# Patient Record
Sex: Female | Born: 2000
Health system: Southern US, Community
[De-identification: ages and names within clinical notes are randomized; demographics above are authoritative.]

---
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
## 2020-07-20 ENCOUNTER — Telehealth: Payer: Self-pay

## 2020-07-20 ENCOUNTER — Ambulatory Visit: Payer: Self-pay

## 2020-07-20 NOTE — Telephone Encounter (Signed)
Called patient to screen prior to coming to clinic. Patient reports she was on vacation last week; returned Sunday and returned to work Wednesday. States she felt fine when she went to sleep last night but woke up this morning with right leg pain. Reports that the pain is primarily sharp pains in her groin and knee, but will shift to her hips and ankle also.   Discussed with K. Arlana Pouch, NP. Patient needs further work up than what urgent care can offer. Informed patient and instructed her to go to the [facility] ED for r/o of DVT per K. Arlana Pouch.   Patient agreeable to plan.

## 2021-07-31 ENCOUNTER — Other Ambulatory Visit: Payer: Self-pay

## 2021-07-31 ENCOUNTER — Encounter (HOSPITAL_COMMUNITY): Payer: Self-pay | Admitting: Pharmacy Technician

## 2021-07-31 ENCOUNTER — Emergency Department (HOSPITAL_COMMUNITY): Payer: BC Managed Care – PPO

## 2021-07-31 ENCOUNTER — Observation Stay (HOSPITAL_COMMUNITY)
Admission: EM | Admit: 2021-07-31 | Discharge: 2021-08-01 | Disposition: A | Discharge status: Home / Self Care | Payer: BC Managed Care – PPO | Attending: Internal Medicine | Admitting: Internal Medicine

## 2021-07-31 DIAGNOSIS — Z7722 Contact with and (suspected) exposure to environmental tobacco smoke (acute) (chronic): Secondary | ICD-10-CM | POA: Insufficient documentation

## 2021-07-31 DIAGNOSIS — Z20822 Contact with and (suspected) exposure to covid-19: Secondary | ICD-10-CM | POA: Diagnosis not present

## 2021-07-31 DIAGNOSIS — D72829 Elevated white blood cell count, unspecified: Secondary | ICD-10-CM | POA: Insufficient documentation

## 2021-07-31 DIAGNOSIS — R55 Syncope and collapse: Principal | ICD-10-CM | POA: Insufficient documentation

## 2021-07-31 LAB — CBC WITH DIFFERENTIAL/PLATELET
Abs Immature Granulocytes: 0.08 10*3/uL — ABNORMAL HIGH (ref 0.00–0.07)
Basophils Absolute: 0.1 10*3/uL (ref 0.0–0.1)
Basophils Relative: 0 %
Eosinophils Absolute: 0 10*3/uL (ref 0.0–0.5)
Eosinophils Relative: 0 %
HCT: 43.9 % (ref 36.0–46.0)
Hemoglobin: 15.1 g/dL — ABNORMAL HIGH (ref 12.0–15.0)
Immature Granulocytes: 0 %
Lymphocytes Relative: 17 %
Lymphs Abs: 3.2 10*3/uL (ref 0.7–4.0)
MCH: 31 pg (ref 26.0–34.0)
MCHC: 34.4 g/dL (ref 30.0–36.0)
MCV: 90.1 fL (ref 80.0–100.0)
Monocytes Absolute: 0.9 10*3/uL (ref 0.1–1.0)
Monocytes Relative: 5 %
Neutro Abs: 14.5 10*3/uL — ABNORMAL HIGH (ref 1.7–7.7)
Neutrophils Relative %: 78 %
Platelets: 336 10*3/uL (ref 150–400)
RBC: 4.87 MIL/uL (ref 3.87–5.11)
RDW: 11.9 % (ref 11.5–15.5)
WBC: 18.8 10*3/uL — ABNORMAL HIGH (ref 4.0–10.5)
nRBC: 0 % (ref 0.0–0.2)

## 2021-07-31 LAB — URINALYSIS, ROUTINE W REFLEX MICROSCOPIC
Bilirubin Urine: NEGATIVE
Glucose, UA: NEGATIVE mg/dL
Ketones, ur: NEGATIVE mg/dL
Nitrite: NEGATIVE
Protein, ur: NEGATIVE mg/dL
Specific Gravity, Urine: 1.012 (ref 1.005–1.030)
pH: 8 (ref 5.0–8.0)

## 2021-07-31 LAB — BASIC METABOLIC PANEL
Anion gap: 10 (ref 5–15)
BUN: 8 mg/dL (ref 6–20)
CO2: 23 mmol/L (ref 22–32)
Calcium: 9.8 mg/dL (ref 8.9–10.3)
Chloride: 104 mmol/L (ref 98–111)
Creatinine, Ser: 0.79 mg/dL (ref 0.44–1.00)
GFR, Estimated: >60 mL/min (ref >60)
Glucose, Bld: 112 mg/dL — ABNORMAL HIGH (ref 70–99)
Potassium: 3.5 mmol/L (ref 3.5–5.1)
Sodium: 137 mmol/L (ref 135–145)

## 2021-07-31 LAB — MAGNESIUM: Magnesium: 2 mg/dL (ref 1.7–2.4)

## 2021-07-31 LAB — CBG MONITORING, ED: Glucose-Capillary: 111 mg/dL — ABNORMAL HIGH (ref 70–99)

## 2021-07-31 LAB — I-STAT BETA HCG BLOOD, ED (MC, WL, AP ONLY): I-stat hCG, quantitative: <5 m[IU]/mL (ref <5)

## 2021-07-31 MED ORDER — ACETAMINOPHEN 650 MG RE SUPP: 650.0000 mg | Freq: Four times a day (QID) | RECTAL | Status: DC | PRN

## 2021-07-31 MED ORDER — ENOXAPARIN SODIUM 40 MG/0.4ML IJ SOSY: 40.0000 mg | PREFILLED_SYRINGE | INTRAMUSCULAR | Status: DC

## 2021-07-31 MED ORDER — ACETAMINOPHEN 325 MG PO TABS: 650.0000 mg | ORAL_TABLET | Freq: Four times a day (QID) | ORAL | Status: DC | PRN

## 2021-07-31 NOTE — ED Notes (Signed)
Pt ambulatory to the bathroom without difficulty.

## 2021-07-31 NOTE — H&P (Signed)
History and Physical    Tiffany Russell OBS:[id number] DOB: [birth year] DOA: 07/31/2021  PCP: Pediatrics, [facility] Consultants:  none  Patient coming from:  Home - lives with mom, dad and boyfriend   Chief Complaint: syncope   HPI: Tiffany Russell is a 20 y.o. female with no medical history who presented to ER after syncopal episode. She was on the toilet around10:30am this morning having a BM and passed out, hitting her face on the plumbing as she fell. She immediately regained consciousness and had no loss of urine, tongue biting, confusion or fatigue. Not witnessed seizure activity by boyfriend. They decided to come to hospital as she was worried about a concussion.  While in triage, she was about to have her blood drawn and as they were wiggling the vein, she had another syncopal episode witnessed by nursing. She stopped responding to nurse, tensed up and then went limp and passed out and would not respond to a sternal rub. Nurse counted her carotid pulse to be around 20. She was breathing, but slow and irregular and was brought back to room where she regained consciousness and was alert and oriented. No shaking, no tongue biting and no loss of urine. No post ictal state.   She has had a similar episode when getting her blood taken before in the past.  No congenital heart issues, no sudden cardiac death or early death  history in family. No history of POTS.  She overall has felt well up until this point. Went to [employer] [place] on Friday and worked over the weekend. Denies any fever, chills, headaches, pain, chest pain, palpitations, dysuria, stomach pain, N/V/D.    ED Course: vitals: afebrile, bp: 125/62, HR: 90, RR: 18 and oxygen: 100% on room air. CTH with no acute findings, CXR wnl. Pertinent labs: WBC: 18.8, UA with moderate leukocytes, moderate hgb. Urine pregnancy negative. Magnesium wnl. Had episode of syncope in triage with pulse down to 20. Asked to admit for syncope.   Review of Systems: As per  HPI; otherwise review of systems reviewed and negative.   Ambulatory Status:  Ambulates without assistance  History reviewed. No pertinent past medical history.  History reviewed. No pertinent surgical history.  Social History   Socioeconomic History   Marital status: Single    Spouse name: Not on file   Number of children: Not on file   Years of education: Not on file   Highest education level: Not on file  Occupational History   Not on file  Tobacco Use   Smoking status: Passive Smoke Exposure - Never Smoker   Smokeless tobacco: Never  Substance and Sexual Activity   Alcohol use: No   Drug use: No   Sexual activity: Not on file  Other Topics Concern   Not on file  Social History Narrative   Not on file   Social Determinants of Health   Financial Resource Strain: Not on file  Food Insecurity: Not on file  Transportation Needs: Not on file  Physical Activity: Not on file  Stress: Not on file  Social Connections: Not on file  Intimate Partner Violence: Not on file    No Known Allergies  No family history on file.  Prior to Admission medications   Medication Sig Start Date End Date Taking? Authorizing Provider  amoxicillin (AMOXIL) 875 MG tablet Take 1 tablet (875 mg total) by mouth 2 (two) times daily. 08/21/17   Deatra Canter, FNP  cyclobenzaprine (FLEXERIL) 5 MG tablet Take 1 tablet (  5 mg total) by mouth every 8 (eight) hours as needed for muscle spasms. 06/15/15   Hess, Nada Boozer, PA-C  lidocaine (XYLOCAINE) 2 % solution Use as directed 10 mLs in the mouth or throat as needed for mouth pain. 08/21/17   Deatra Canter, FNP  naproxen (NAPROSYN) 500 MG tablet Take 1 tablet (500 mg total) by mouth 2 (two) times daily. 06/15/15   Kathrynn Speed, PA-C    Physical Exam: Vitals:   07/31/21 1445 07/31/21 1500 07/31/21 1515 07/31/21 1655  BP: 103/62 115/63 107/82 119/69  Pulse: 72 85 81 92  Resp: 14 19 20  (!) 22  Temp:      SpO2: 99% 97% 100% 97%     General:   Appears calm and comfortable and is in NAD Eyes:  PERRL, EOMI, normal lids, iris ENT:  grossly normal hearing, lips & tongue, mmm; appropriate dentition Neck:  no LAD, masses or thyromegaly; no carotid bruits Cardiovascular:  RRR, no m/r/g. No LE edema.  Respiratory:   CTA bilaterally with no wheezes/rales/rhonchi.  Normal respiratory effort. Abdomen:  soft, NT, ND, NABS Back:   normal alignment, no CVAT Skin:  no rash or induration seen on limited exam Musculoskeletal:  grossly normal tone BUE/BLE, good ROM, no bony abnormality Lower extremity:  No LE edema.  Limited foot exam with no ulcerations.  2+ distal pulses. Psychiatric:  grossly normal mood and affect, speech fluent and appropriate, AOx3 Neurologic:  CN 2-12 grossly intact, moves all extremities in coordinated fashion, sensation intact    Radiological Exams on Admission: Independently reviewed - see discussion in A/P where applicable  CT HEAD WO CONTRAST ( )  Result Date: 07/31/2021 CLINICAL DATA:  Head trauma, loss of consciousness EXAM: CT HEAD WITHOUT CONTRAST TECHNIQUE: Contiguous axial images were obtained from the base of the skull through the vertex without intravenous contrast. COMPARISON:  March 21, 2018 FINDINGS: Brain: No evidence of acute large vascular territory infarction, hemorrhage, hydrocephalus, extra-axial collection or mass lesion/mass effect. Vascular: No hyperdense vessel or unexpected calcification. Skull: Normal. Negative for fracture or focal lesion. Sinuses/Orbits: Mucous retention cyst fills the left sphenoid sinus otherwise the visualized portions of the paranasal sinuses and mastoid air cells are predominantly clear. Orbits are unremarkable. Other: None. IMPRESSION: 1. No acute intracranial findings. 2. Left sphenoid sinus mucous retention cyst. Electronically Signed   By: March 23, 2018 MD   On: 07/31/2021 16:20   DG Chest Portable 1 View  Result Date: 07/31/2021 CLINICAL DATA:  Syncope EXAM: PORTABLE  CHEST 1 VIEW COMPARISON:  None. FINDINGS: The cardiomediastinal silhouette is within normal limits. The lungs are clear, with no focal consolidation or pulmonary edema. There is no pleural effusion or pneumothorax. The bones are unremarkable. IMPRESSION: No radiographic evidence of acute cardiopulmonary process. Electronically Signed   By: 09/30/2021 MD   On: 07/31/2021 14:42    EKG: Independently reviewed.  NSR with rate 92; nonspecific ST changes with no evidence of acute ischemia   Labs on Admission: I have personally reviewed the available labs and imaging studies at the time of the admission.  Pertinent labs:  Pertinent labs: WBC: 18.8 UA with moderate leukocytes, moderate hgb.   Assessment/Plan Principal Problem:   Syncope and collapse Due to x2 episodes of syncope and HR to 20 in ER will place her in observation with syncope work up. Episodes sound more like vaso vagal in nature and with no post ictal state, loss of urine, tongue biting, etc. Do not feel like she  is having absent seizures.  Orthostatic vitals ordered Echo pending Place on telemetry Encouraged PO intake of liquids   Active Problems:   Leukocytosis Likely reactive as she has no fever or source of infection Encouraged PO intake and will follow in AM Urine has some abnormal findings, but she has no dysuria, frequency or urgency so will not check culture at this time Microscopic hematuria: spotting with copper IUD.    There is no height or weight on file to calculate BMI.   Level of care: Telemetry Medical DVT prophylaxis:  SCDs and ambulation  Code Status:  Full - confirmed with patient Family Communication: mother present at bedside: Tiffany Russell  Disposition Plan:  The patient is from: home  Anticipated d/c is to: home  Anticipated d/c date will depend on clinical response to treatment, but possibly as early as tomorrow if she has excellent response to treatment  Requires inpatient hospitalization due to  risk of cardiac decompensation and risk of future syncope and collapse due to possible cardiac cause that requires overnight monitoring, assessment and work up.  Consults called: none   Admission status:  observation    Orland Mustard MD [facility]   How to contact the [facility] Attending or Consulting provider 7A - 7P or covering provider during after hours 7P -7A, for this patient?  Check the care team in [facility] and look for a) attending/consulting [facility] provider listed and b) the [facility] team listed Log into [email or web address] and use [facility] universal password to access. If you do not have the password, please contact the hospital operator. Locate the [facility] provider you are looking for under [facility] and page to a number that you can be directly reached. If you still have difficulty reaching the provider, please page the [facility] (Director on Call) for the Hospitalists listed on amion for assistance.   07/31/2021, 6:25 PM

## 2021-07-31 NOTE — ED Triage Notes (Signed)
Pt here for evaluation after having a syncopal event while having a bowel movement. Pt states she woke up on the floor and hit her head on the floor. Pt with hx concussion. Neurologically intact.

## 2021-07-31 NOTE — ED Notes (Signed)
Pt NAD, a/ox4, states was trying to have BM today and had syncopal episode while on toilet, falling over and hitting head. Bruise noted to left temple/forehead. Pt states she also passed out during blood draw while here in the ED. Denies dizziness, CP, SOB, ABD pain, n/v/d. VSS.

## 2021-07-31 NOTE — ED Notes (Signed)
While pt sitting in triage pt tensed up, pupils became a 7, pt no longer responding. Pt then went flaccid and pt would not respond to sternal rub. Carotid pulse faint with rate around 20. Pt breathing slowed and became irregular. NRB placed and patient moved to treatment room.

## 2021-07-31 NOTE — ED Provider Notes (Signed)
[facility] EMERGENCY DEPARTMENT Provider Note   CSN: [id number] Arrival date & time: 07/31/21  1119     History Chief Complaint  Patient presents with   Loss of Consciousness    Tiffany Russell is a 20 y.o. female.  Presents here with concern for syncope episode.  Patient reports that she was at home in her normal state of health when she was walking to the bathroom and she felt lightheaded like she was going to pass out and then passed out fell and hit her head.  Boyfriend nearby.  No witnessed seizure activity.  No prolonged postictal state.  While in the waiting room patient had a second episode of passing out.  RN reports patient tensed up had slight muscle jerk, looks like she may be having an absence seizure.  Carotid pulse felt around 20.  Patient's breathing slow and irregular.  Patient reports that she has not had episodes like this before.  No known medical problems.  Does have history of concussion.  No family history of sudden cardiac death.  Does not have any pain at present, specifically denies any neck back chest or abdominal pain.  HPI     History reviewed. No pertinent past medical history.  There are no problems to display for this patient.   History reviewed. No pertinent surgical history.   OB History   No obstetric history on file.     No family history on file.  Social History   Tobacco Use   Smoking status: Passive Smoke Exposure - Never Smoker   Smokeless tobacco: Never  Substance Use Topics   Alcohol use: No   Drug use: No    Home Medications Prior to Admission medications   Medication Sig Start Date End Date Taking? Authorizing Provider  amoxicillin (AMOXIL) 875 MG tablet Take 1 tablet (875 mg total) by mouth 2 (two) times daily. 08/21/17   Deatra Canter, FNP  cyclobenzaprine (FLEXERIL) 5 MG tablet Take 1 tablet (5 mg total) by mouth every 8 (eight) hours as needed for muscle spasms. 06/15/15   Hess, Nada Boozer, PA-C  lidocaine  (XYLOCAINE) 2 % solution Use as directed 10 mLs in the mouth or throat as needed for mouth pain. 08/21/17   Deatra Canter, FNP  naproxen (NAPROSYN) 500 MG tablet Take 1 tablet (500 mg total) by mouth 2 (two) times daily. 06/15/15   Hess, Nada Boozer, PA-C    Allergies    Patient has no known allergies.  Review of Systems   Review of Systems  Constitutional:  Negative for chills and fever.  HENT:  Negative for ear pain and sore throat.   Eyes:  Negative for pain and visual disturbance.  Respiratory:  Negative for cough and shortness of breath.   Cardiovascular:  Negative for chest pain and palpitations.  Gastrointestinal:  Negative for abdominal pain and vomiting.  Genitourinary:  Negative for dysuria and hematuria.  Musculoskeletal:  Negative for arthralgias and back pain.  Skin:  Negative for color change and rash.  Neurological:  Positive for syncope. Negative for seizures.  All other systems reviewed and are negative.  Physical Exam Updated Vital Signs BP 115/63   Pulse 85   Temp 98 F (36.7 C)   Resp 19   SpO2 97%   Physical Exam Vitals and nursing note reviewed.  Constitutional:      General: She is not in acute distress.    Appearance: She is well-developed.  HENT:  Head: Normocephalic and atraumatic.  Eyes:     Conjunctiva/sclera: Conjunctivae normal.  Cardiovascular:     Rate and Rhythm: Normal rate and regular rhythm.     Heart sounds: No murmur heard. Pulmonary:     Effort: Pulmonary effort is normal. No respiratory distress.     Breath sounds: Normal breath sounds.  Abdominal:     Palpations: Abdomen is soft.     Tenderness: There is no abdominal tenderness.  Musculoskeletal:     Cervical back: Neck supple.  Skin:    General: Skin is warm and dry.  Neurological:     General: No focal deficit present.     Mental Status: She is alert and oriented to person, place, and time.  Psychiatric:        Mood and Affect: Mood normal.        Behavior: Behavior  normal.    ED Results / Procedures / Treatments   Labs (all labs ordered are listed, but only abnormal results are displayed) Labs Reviewed  CBC WITH DIFFERENTIAL/PLATELET - Abnormal; Notable for the following components:      Result Value   WBC 18.8 (*)    Hemoglobin 15.1 (*)    Neutro Abs 14.5 (*)    Abs Immature Granulocytes 0.08 (*)    All other components within normal limits  BASIC METABOLIC PANEL - Abnormal; Notable for the following components:   Glucose, Bld 112 (*)    All other components within normal limits  CBG MONITORING, ED - Abnormal; Notable for the following components:   Glucose-Capillary 111 (*)    All other components within normal limits  MAGNESIUM  URINALYSIS, ROUTINE W REFLEX MICROSCOPIC  I-STAT BETA HCG BLOOD, ED ([facility], [facility], [facility] ONLY)    EKG EKG Interpretation  Date/Time:  Tuesday July 31 2021 14:02:59 EDT Ventricular Rate:  92 PR Interval:  129 QRS Duration: 91 QT Interval:  350 QTC Calculation: 433 R Axis:   76 Text Interpretation: Sinus rhythm Confirmed by Marianna Fuss ([id number]) on 07/31/2021 2:24:37 PM  Radiology DG Chest Portable 1 View  Result Date: 07/31/2021 CLINICAL DATA:  Syncope EXAM: PORTABLE CHEST 1 VIEW COMPARISON:  None. FINDINGS: The cardiomediastinal silhouette is within normal limits. The lungs are clear, with no focal consolidation or pulmonary edema. There is no pleural effusion or pneumothorax. The bones are unremarkable. IMPRESSION: No radiographic evidence of acute cardiopulmonary process. Electronically Signed   By: Lesia Hausen MD   On: 07/31/2021 14:42    Procedures Procedures   Medications Ordered in ED Medications - No data to display  ED Course  I have reviewed the triage vital signs and the nursing notes.  Pertinent labs & imaging results that were available during my care of the patient were reviewed by me and considered in my medical decision making (see chart for details).    MDM Rules/Calculators/A&P                            20 year old lady presents to ER with concern for syncope.  While in waiting room patient had a second syncopal episode.  There was question of possible seizure-like activity but patient did not have postictal state, no bladder or bowel incontinence.  RN had reported bradycardic pulse.  I evaluated patient as she was being placed in the trauma room.  She was alert, her initial heart rate was normal.  EKG did not have any interval abnormalities.  Her basic labs were  stable.  No leukocytosis but no infectious symptoms.  Will check head CT given the reported head trauma and the question of seizure activity.  While awaiting head scan and reassessment, signed out to Dr. Lynelle Doctor.  Given these 2 episodes of syncope and report of bradycardia, believe patient would likely benefit from admission for further observation.  Final Clinical Impression(s) / ED Diagnoses Final diagnoses:  Syncope, unspecified syncope type    Rx / DC Orders ED Discharge Orders     None        Milagros Loll, MD 07/31/21 1537

## 2021-08-01 ENCOUNTER — Encounter (HOSPITAL_COMMUNITY): Payer: Self-pay | Admitting: Pharmacy Technician

## 2021-08-01 ENCOUNTER — Observation Stay (HOSPITAL_BASED_OUTPATIENT_CLINIC_OR_DEPARTMENT_OTHER): Payer: BC Managed Care – PPO

## 2021-08-01 DIAGNOSIS — R55 Syncope and collapse: Secondary | ICD-10-CM | POA: Diagnosis not present

## 2021-08-01 LAB — CBC
HCT: 39.3 % (ref 36.0–46.0)
Hemoglobin: 13.5 g/dL (ref 12.0–15.0)
MCH: 31 pg (ref 26.0–34.0)
MCHC: 34.4 g/dL (ref 30.0–36.0)
MCV: 90.3 fL (ref 80.0–100.0)
Platelets: 286 10*3/uL (ref 150–400)
RBC: 4.35 MIL/uL (ref 3.87–5.11)
RDW: 11.9 % (ref 11.5–15.5)
WBC: 11.6 10*3/uL — ABNORMAL HIGH (ref 4.0–10.5)
nRBC: 0 % (ref 0.0–0.2)

## 2021-08-01 LAB — ECHOCARDIOGRAM COMPLETE
Area-P 1/2: 3.45 cm2
S' Lateral: 2.6 cm

## 2021-08-01 LAB — SARS CORONAVIRUS 2 (TAT 6-24 HRS): SARS Coronavirus 2: NEGATIVE

## 2021-08-01 LAB — HIV ANTIBODY (ROUTINE TESTING W REFLEX): HIV Screen 4th Generation wRfx: NONREACTIVE

## 2021-08-01 NOTE — Discharge Summary (Addendum)
Physician Discharge Summary  Tiffany Russell MGQ:[id number] DOB: [birth year] DOA: 07/31/2021  PCP: [facility]  Admit date: 07/31/2021 Discharge date: 08/01/2021  Admitted From: Home Disposition:  Home  Recommendations for Outpatient Follow-up:  Follow up with PCP in 1 week  Discharge Condition: Stable CODE STATUS: Full  Diet recommendation: Regular   Brief/Interim Summary: From H&P by Dr. Artis Flock: "Tiffany Russell is a 20 y.o. female with no medical history who presented to ER after syncopal episode. She was on the toilet around10:30am this morning having a BM and passed out, hitting her face on the plumbing as she fell. She immediately regained consciousness and had no loss of urine, tongue biting, confusion or fatigue. Not witnessed seizure activity by boyfriend. They decided to come to hospital as she was worried about a concussion.  While in triage, she was about to have her blood drawn and as they were wiggling the vein, she had another syncopal episode witnessed by nursing. She stopped responding to nurse, tensed up and then went limp and passed out and would not respond to a sternal rub. Nurse counted her carotid pulse to be around 20. She was breathing, but slow and irregular and was brought back to room where she regained consciousness and was alert and oriented. No shaking, no tongue biting and no loss of urine. No post ictal state.   She has had a similar episode when getting her blood taken before in the past.  No congenital heart issues, no sudden cardiac death or early death  history in family. No history of POTS.  She overall has felt well up until this point. [place] to [employer] on Friday and worked over the weekend. Denies any fever, chills, headaches, pain, chest pain, palpitations, dysuria, stomach pain, N/V/D.   ED Course: vitals: afebrile, bp: 125/62, HR: 90, RR: 18 and oxygen: 100% on room air. CTH with no acute findings, CXR wnl. Pertinent labs: WBC: 18.8, UA with moderate  leukocytes, moderate hgb. Urine pregnancy negative. Magnesium wnl. Had episode of syncope in triage with pulse down to 20. Asked to admit for syncope."  Orthostatic vital sign was negative. Patient's echocardiogram was normal without structural abnormalities.  On day of discharge, patient was feeling back to her baseline, had no complaints, had stable vital signs.  Her syncopal episodes are likely secondary to vasovagal response.  Leukocytosis improved, this was likely reactive to stress response.  Discharge Diagnoses:  Principal Problem:   Syncope and collapse Active Problems:   Leukocytosis   Discharge Instructions  Discharge Instructions     Call MD for:  difficulty breathing, headache or visual disturbances   Complete by: As directed    Call MD for:  extreme fatigue   Complete by: As directed    Call MD for:  persistant dizziness or light-headedness   Complete by: As directed    Call MD for:  persistant nausea and vomiting   Complete by: As directed    Call MD for:  severe uncontrolled pain   Complete by: As directed    Call MD for:  temperature >100.4   Complete by: As directed    Discharge instructions   Complete by: As directed    You were cared for by a hospitalist during your hospital stay. If you have any questions about your discharge medications or the care you received while you were in the hospital after you are discharged, you can call the unit and ask to speak with the hospitalist on call if the  hospitalist that took care of you is not available. Once you are discharged, your primary care physician will handle any further medical issues. Please note that NO REFILLS for any discharge medications will be authorized once you are discharged, as it is imperative that you return to your primary care physician (or establish a relationship with a primary care physician if you do not have one) for your aftercare needs so that they can reassess your need for medications and monitor  your lab values.   Increase activity slowly   Complete by: As directed       Allergies as of 08/01/2021   No Known Allergies      Medication List     STOP taking these medications    amoxicillin 875 MG tablet Commonly known as: AMOXIL   cyclobenzaprine 5 MG tablet Commonly known as: FLEXERIL   lidocaine 2 % solution Commonly known as: XYLOCAINE   naproxen 500 MG tablet Commonly known as: NAPROSYN        Follow-up Information     [facility]. Schedule an appointment as soon as possible for a visit in 1 week(s).   Contact information: [address] [place] [address] [phone]                No Known Allergies  Consultations: None    Procedures/Studies: CT HEAD WO CONTRAST ( )  Result Date: 07/31/2021 CLINICAL DATA:  Head trauma, loss of consciousness EXAM: CT HEAD WITHOUT CONTRAST TECHNIQUE: Contiguous axial images were obtained from the base of the skull through the vertex without intravenous contrast. COMPARISON:  March 21, 2018 FINDINGS: Brain: No evidence of acute large vascular territory infarction, hemorrhage, hydrocephalus, extra-axial collection or mass lesion/mass effect. Vascular: No hyperdense vessel or unexpected calcification. Skull: Normal. Negative for fracture or focal lesion. Sinuses/Orbits: Mucous retention cyst fills the left sphenoid sinus otherwise the visualized portions of the paranasal sinuses and mastoid air cells are predominantly clear. Orbits are unremarkable. Other: None. IMPRESSION: 1. No acute intracranial findings. 2. Left sphenoid sinus mucous retention cyst. Electronically Signed   By: Maudry Mayhew MD   On: 07/31/2021 16:20   DG Chest Portable 1 View  Result Date: 07/31/2021 CLINICAL DATA:  Syncope EXAM: PORTABLE CHEST 1 VIEW COMPARISON:  None. FINDINGS: The cardiomediastinal silhouette is within normal limits. The lungs are clear, with no focal consolidation or pulmonary edema. There is no pleural effusion  or pneumothorax. The bones are unremarkable. IMPRESSION: No radiographic evidence of acute cardiopulmonary process. Electronically Signed   By: Lesia Hausen MD   On: 07/31/2021 14:42   ECHOCARDIOGRAM COMPLETE  Result Date: 08/01/2021    ECHOCARDIOGRAM REPORT   Patient Name:   [facility] Date of Exam: 08/01/2021 Medical Rec #:  [id number]   Height: Accession #:    [id number]  Weight:       141.4 lb Date of Birth:  05-20-2001   BSA:          1.603 m Patient Age:    20 years    BP:           109/68 mmHg Patient Gender: F           HR:           73 bpm. Exam Location:  Inpatient Procedure: 2D Echo, Cardiac Doppler and Color Doppler Indications:    R55 Syncope  History:        Patient has no prior history of Echocardiogram examinations.  Sonographer:    Elmarie Shiley  Dance Referring Phys: [id number] ALLISON WOLFE IMPRESSIONS  1. Left ventricular ejection fraction, by estimation, is 60 to 65%. The left ventricle has normal function. The left ventricle has no regional wall motion abnormalities. Left ventricular diastolic parameters were normal.  2. Right ventricular systolic function is normal. The right ventricular size is normal. Tricuspid regurgitation signal is inadequate for assessing PA pressure.  3. The mitral valve is grossly normal. Trivial mitral valve regurgitation.  4. The aortic valve is tricuspid. Aortic valve regurgitation is not visualized.  5. The inferior vena cava is normal in size with greater than 50% respiratory variability, suggesting right atrial pressure of 3 mmHg. Comparison(s): No prior Echocardiogram. Conclusion(s)/Recommendation(s): Normal biventricular function without evidence of hemodynamically significant valvular heart disease. FINDINGS  Left Ventricle: Left ventricular ejection fraction, by estimation, is 60 to 65%. The left ventricle has normal function. The left ventricle has no regional wall motion abnormalities. The left ventricular internal cavity size was normal in size. There is  no left  ventricular hypertrophy. Left ventricular diastolic parameters were normal. Right Ventricle: The right ventricular size is normal. No increase in right ventricular wall thickness. Right ventricular systolic function is normal. Tricuspid regurgitation signal is inadequate for assessing PA pressure. Left Atrium: Left atrial size was normal in size. Right Atrium: Right atrial size was normal in size. Pericardium: There is no evidence of pericardial effusion. Mitral Valve: The mitral valve is grossly normal. Trivial mitral valve regurgitation. Tricuspid Valve: The tricuspid valve is grossly normal. Tricuspid valve regurgitation is trivial. Aortic Valve: The aortic valve is tricuspid. Aortic valve regurgitation is not visualized. Pulmonic Valve: The pulmonic valve was grossly normal. Pulmonic valve regurgitation is trivial. Aorta: The aortic root and ascending aorta are structurally normal, with no evidence of dilitation. Venous: The inferior vena cava is normal in size with greater than 50% respiratory variability, suggesting right atrial pressure of 3 mmHg. IAS/Shunts: No atrial level shunt detected by color flow Doppler.  LEFT VENTRICLE PLAX 2D LVIDd:         4.30 cm  Diastology LVIDs:         2.60 cm  LV e' medial:    12.50 cm/s LV PW:         0.90 cm  LV E/e' medial:  8.5 LV IVS:        0.90 cm  LV e' lateral:   18.80 cm/s LVOT diam:     2.00 cm  LV E/e' lateral: 5.7 LV SV:         64 LV SV Index:   40 LVOT Area:     3.14 cm  RIGHT VENTRICLE             IVC RV Basal diam:  2.90 cm     IVC diam: 1.50 cm RV S prime:     11.10 cm/s TAPSE (M-mode): 2.2 cm LEFT ATRIUM             Index       RIGHT ATRIUM           Index LA diam:        3.20 cm 2.00 cm/m  RA Area:     11.60 cm LA Vol (A2C):   52.0 ml 32.44 ml/m RA Volume:   28.10 ml  17.53 ml/m LA Vol (A4C):   47.1 ml 29.39 ml/m LA Biplane Vol: 51.5 ml 32.13 ml/m  AORTIC VALVE             PULMONIC VALVE LVOT Vmax:  116.00 cm/s PR End Diast Vel: 1.70 msec LVOT  Vmean:  74.450 cm/s LVOT VTI:    0.203 m  AORTA Ao Root diam: 2.70 cm Ao Asc diam:  2.40 cm MITRAL VALVE MV Area (PHT): 3.45 cm     SHUNTS MV Decel Time: 220 msec     Systemic VTI:  0.20 m MV E velocity: 106.50 cm/s  Systemic Diam: 2.00 cm Zoila ShutterKenneth Hilty MD Electronically signed by Zoila ShutterKenneth Hilty MD Signature Date/Time: 08/01/2021/12:56:05 PM    Final        Discharge Exam: Vitals:   08/01/21 0900 08/01/21 1221  BP: 109/68 115/66  Pulse: 84 89  Resp: 19 16  Temp:    SpO2: 97% 100%    General: Pt is alert, awake, not in acute distress Cardiovascular: RRR, S1/S2 +, no edema Respiratory: CTA bilaterally, no wheezing, no rhonchi, no respiratory distress, no conversational dyspnea  Abdominal: Soft, NT, ND, bowel sounds + Extremities: no edema, no cyanosis Psych: Normal mood and affect, stable judgement and insight     The results of significant diagnostics from this hospitalization (including imaging, microbiology, ancillary and laboratory) are listed below for reference.     Microbiology: Recent Results (from the past 240 hour(s))  SARS CORONAVIRUS 2 (TAT 6-24 HRS) Nasopharyngeal Nasopharyngeal Swab     Status: None   Collection Time: 08/01/21  4:48 AM   Specimen: Nasopharyngeal Swab  Result Value Ref Range Status   SARS Coronavirus 2 NEGATIVE NEGATIVE Final    Comment: (NOTE) SARS-CoV-2 target nucleic acids are NOT DETECTED.  The SARS-CoV-2 RNA is generally detectable in upper and lower respiratory specimens during the acute phase of infection. Negative results do not preclude SARS-CoV-2 infection, do not rule out co-infections with other pathogens, and should not be used as the sole basis for treatment or other patient management decisions. Negative results must be combined with clinical observations, patient history, and epidemiological information. The expected result is Negative.  Fact Sheet for Patients: HairSlick.nohttps://www.fda.gov/media/138098/download  Fact Sheet for  Healthcare Providers: quierodirigir.comhttps://www.fda.gov/media/138095/download  This test is not yet approved or cleared by the [place] FDA and  has been authorized for detection and/or diagnosis of SARS-CoV-2 by FDA under an Emergency Use Authorization (EUA). This EUA will remain  in effect (meaning this test can be used) for the duration of the COVID-19 declaration under Se ction 564(b)(1) of the Act, 21 U.S.C. section 360bbb-3(b)(1), unless the authorization is terminated or revoked sooner.  Performed at [facility], [address] [place], [place] [address]      Labs: BNP (last 3 results) No results for input(s): BNP in the last 8760 hours. Basic Metabolic Panel: Recent Labs  Lab 07/31/21 1401  NA 137  K 3.5  CL 104  CO2 23  GLUCOSE 112*  BUN 8  CREATININE 0.79  CALCIUM 9.8  MG 2.0   Liver Function Tests: No results for input(s): AST, ALT, ALKPHOS, BILITOT, PROT, ALBUMIN in the last 168 hours. No results for input(s): LIPASE, AMYLASE in the last 168 hours. No results for input(s): AMMONIA in the last 168 hours. CBC: Recent Labs  Lab 07/31/21 1401 08/01/21 0404  WBC 18.8* 11.6*  NEUTROABS 14.5*  --   HGB 15.1* 13.5  HCT 43.9 39.3  MCV 90.1 90.3  PLT 336 286   Cardiac Enzymes: No results for input(s): CKTOTAL, CKMB, CKMBINDEX, TROPONINI in the last 168 hours. BNP: Invalid input(s): POCBNP CBG: Recent Labs  Lab 07/31/21 1359  GLUCAP 111*   D-Dimer  No results for input(s): DDIMER in the last 72 hours. Hgb A1c No results for input(s): HGBA1C in the last 72 hours. Lipid Profile No results for input(s): CHOL, HDL, LDLCALC, TRIG, CHOLHDL, LDLDIRECT in the last 72 hours. Thyroid function studies No results for input(s): TSH, T4TOTAL, T3FREE, THYROIDAB in the last 72 hours.  Invalid input(s): FREET3 Anemia work up No results for input(s): VITAMINB12, FOLATE, FERRITIN, TIBC, IRON, RETICCTPCT in the last 72 hours. Urinalysis    Component Value Date/Time    COLORURINE YELLOW 07/31/2021 1546   APPEARANCEUR HAZY (A) 07/31/2021 1546   LABSPEC 1.012 07/31/2021 1546   PHURINE 8.0 07/31/2021 1546   GLUCOSEU NEGATIVE 07/31/2021 1546   HGBUR MODERATE (A) 07/31/2021 1546   BILIRUBINUR NEGATIVE 07/31/2021 1546   KETONESUR NEGATIVE 07/31/2021 1546   PROTEINUR NEGATIVE 07/31/2021 1546   UROBILINOGEN 0.2 06/15/2015 2040   NITRITE NEGATIVE 07/31/2021 1546   LEUKOCYTESUR MODERATE (A) 07/31/2021 1546   Sepsis Labs Invalid input(s): PROCALCITONIN,  WBC,  LACTICIDVEN Microbiology Recent Results (from the past 240 hour(s))  SARS CORONAVIRUS 2 (TAT 6-24 HRS) Nasopharyngeal Nasopharyngeal Swab     Status: None   Collection Time: 08/01/21  4:48 AM   Specimen: Nasopharyngeal Swab  Result Value Ref Range Status   SARS Coronavirus 2 NEGATIVE NEGATIVE Final    Comment: (NOTE) SARS-CoV-2 target nucleic acids are NOT DETECTED.  The SARS-CoV-2 RNA is generally detectable in upper and lower respiratory specimens during the acute phase of infection. Negative results do not preclude SARS-CoV-2 infection, do not rule out co-infections with other pathogens, and should not be used as the sole basis for treatment or other patient management decisions. Negative results must be combined with clinical observations, patient history, and epidemiological information. The expected result is Negative.  Fact Sheet for Patients: HairSlick.no  Fact Sheet for Healthcare Providers: quierodirigir.com  This test is not yet approved or cleared by the [place] FDA and  has been authorized for detection and/or diagnosis of SARS-CoV-2 by FDA under an Emergency Use Authorization (EUA). This EUA will remain  in effect (meaning this test can be used) for the duration of the COVID-19 declaration under Se ction 564(b)(1) of the Act, 21 U.S.C. section 360bbb-3(b)(1), unless the authorization is terminated or revoked  sooner.  Performed at [facility], [address] [place], [place] [address]      Patient was seen and examined on the day of discharge and was found to be in stable condition. Time coordinating discharge: 35 minutes including assessment and coordination of care, as well as examination of the patient.   SIGNED:  Noralee Stain, DO [facility] 08/01/2021, 1:03 PM

## 2021-08-01 NOTE — Progress Notes (Signed)
  Echocardiogram 2D Echocardiogram has been performed.  Tiffany Russell G Tiffany Russell 08/01/2021, 11:05 AM

## 2021-08-01 NOTE — ED Notes (Signed)
D/c instruction reviewed. Pt has no question or concerns. Pt axox4, stable and ambulatory.

## 2021-12-10 IMAGING — CT CT HEAD W/O CM
4 series · 17 of 47 positions shown, 19 images · non-contrast
Comparison: March 21, 2018

CLINICAL DATA: Head trauma, loss of consciousness

EXAM:
CT HEAD WITHOUT CONTRAST
TECHNIQUE: Contiguous axial images were obtained from the base of the skull
through the vertex without intravenous contrast.

[Series 2: head wo · axial · 0.41mm/px · z∈[-149,-29]mm · 7 of 32 slices shown, 9 images]
[im 4/32  brain]
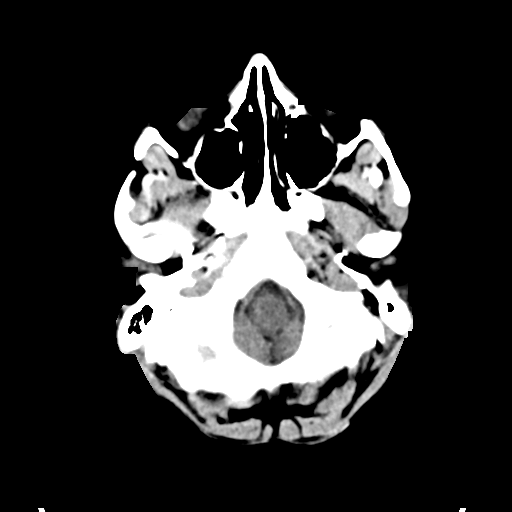
[im 4/32  bone]
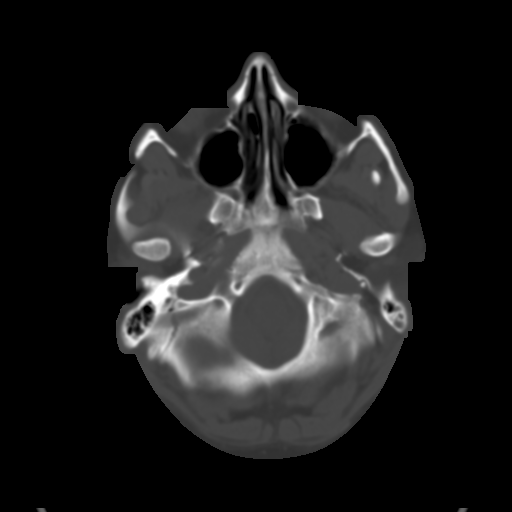
[im 8/32  brain]
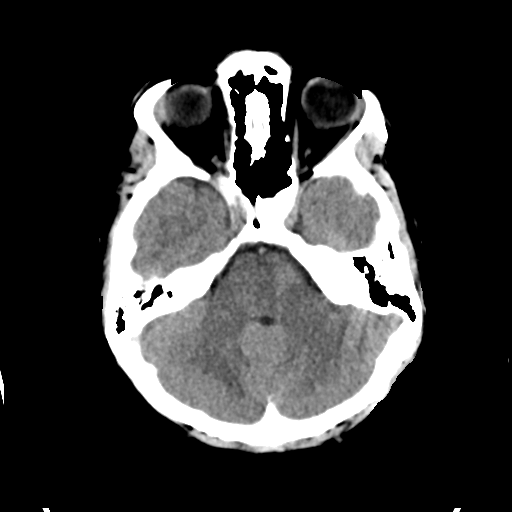
[im 12/32  brain]
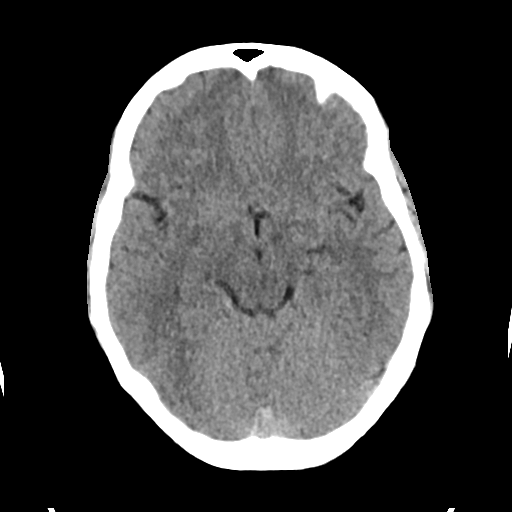
[im 16/32  brain]
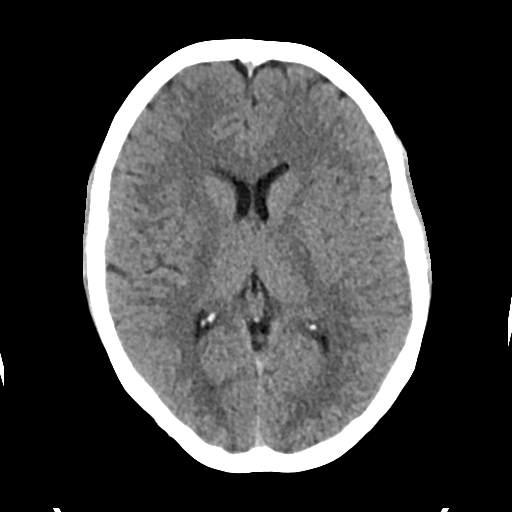
[im 20/32  brain]
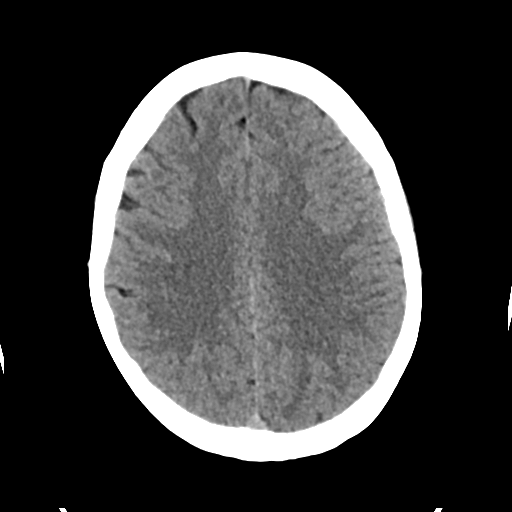
[im 20/32  bone]
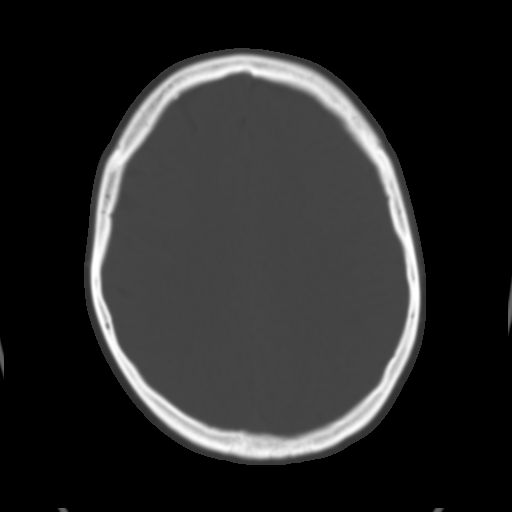
[im 24/32  brain]
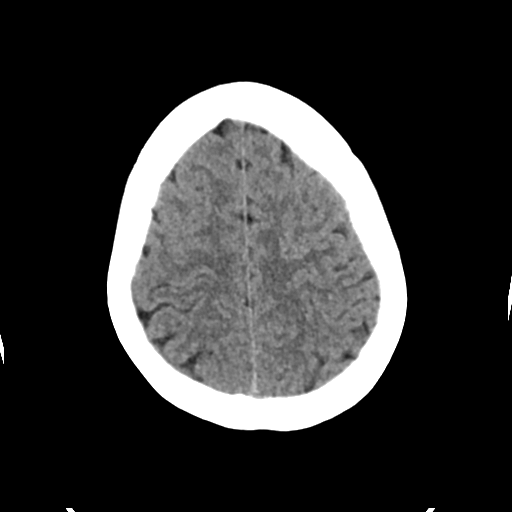
[im 28/32  brain]
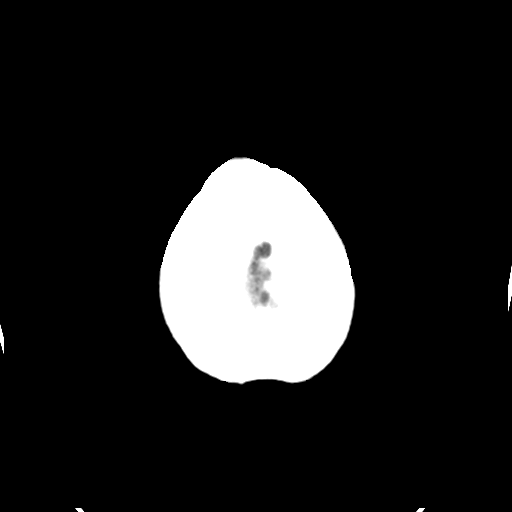

[Series 3: head bone · axial · 0.41mm/px · z∈[-150,-94]mm · 4 of 79 slices shown]
[im 8/79  bone]
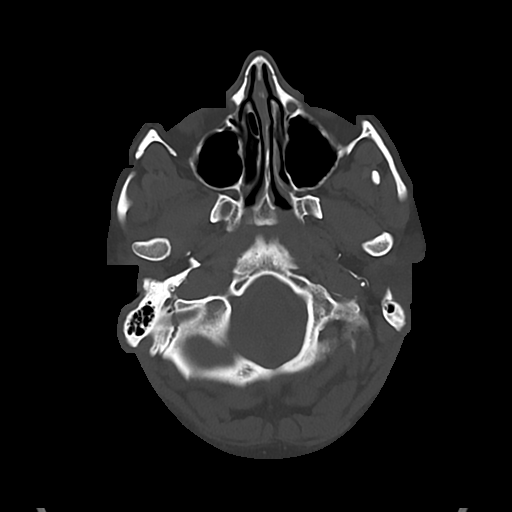
[im 16/79  bone]
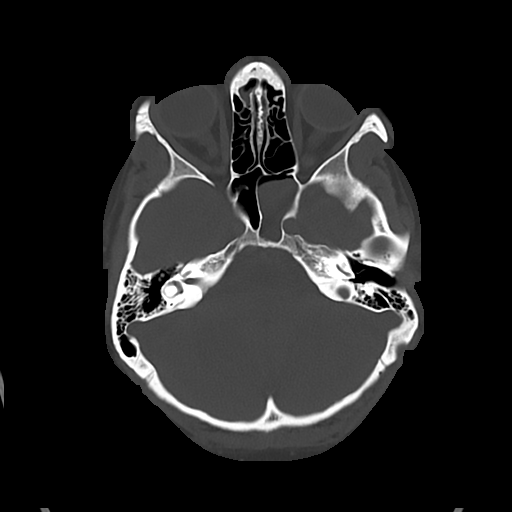
[im 24/79  bone]
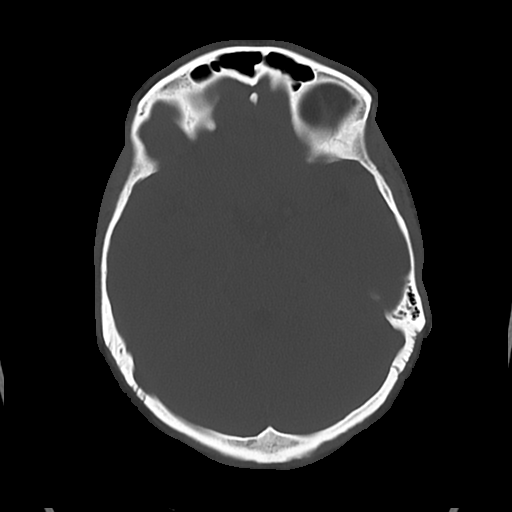
[im 36/79  bone]
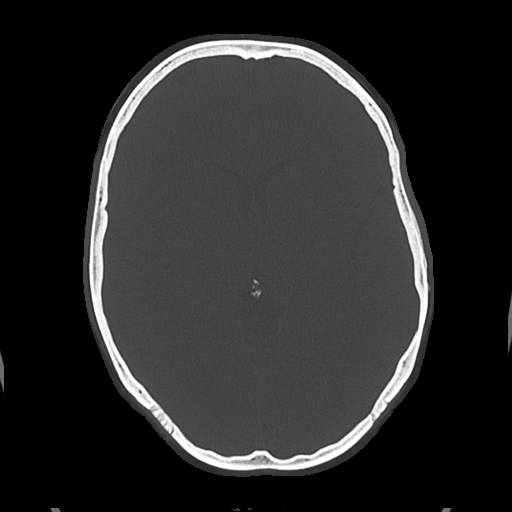

[Series 4: cor soft · coronal · 0.33mm/px · 3 of 71 slices shown]
[im 24/71  brain]
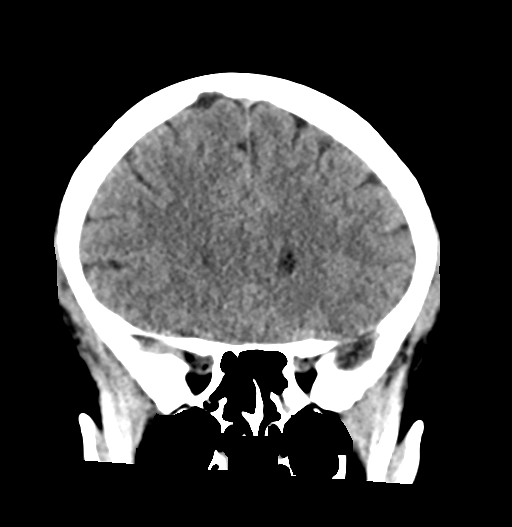
[im 32/71  brain]
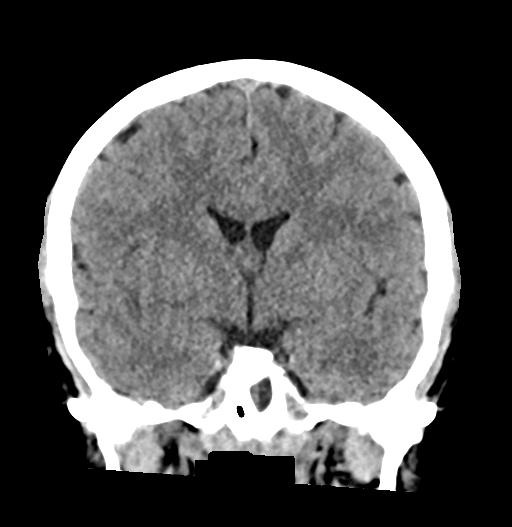
[im 39/71  brain]
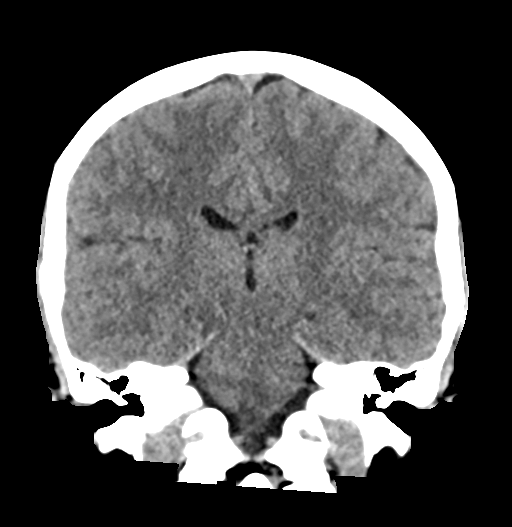

[Series 5: sag soft · sagittal · 0.34mm/px · 3 of 57 slices shown]
[im 19/57  brain]
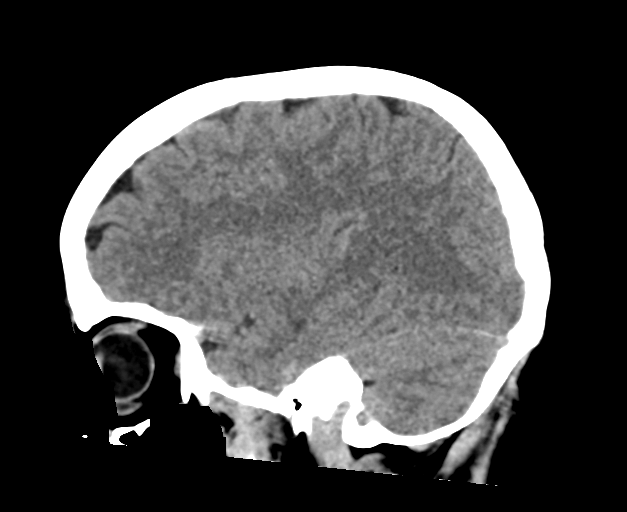
[im 29/57  brain]
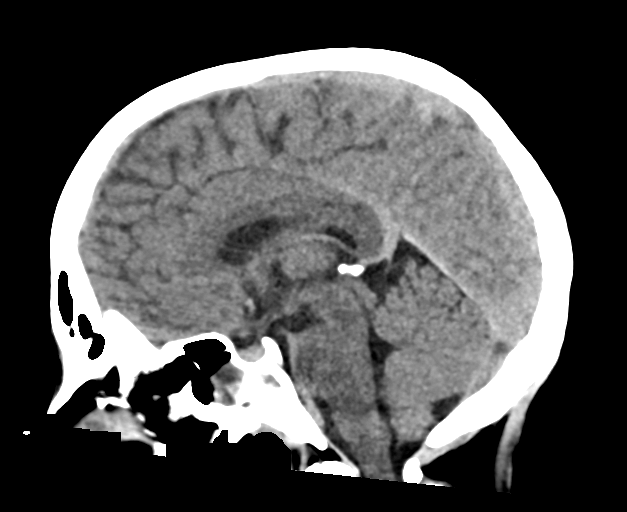
[im 38/57  brain]
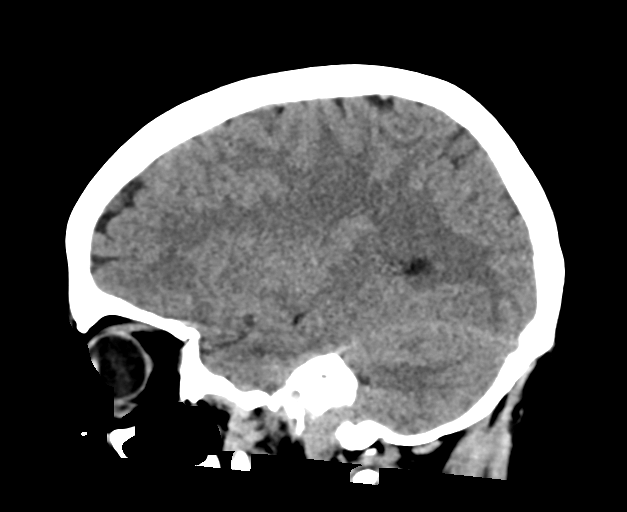

[17 of 47 positions shown; findings below may reference images not displayed]

FINDINGS: Brain: No evidence of acute large vascular territory infarction,
hemorrhage, hydrocephalus, extra-axial collection or mass
lesion/mass effect.

Vascular: No hyperdense vessel or unexpected calcification.

Skull: Normal. Negative for fracture or focal lesion.

Sinuses/Orbits: Mucous retention cyst fills the left sphenoid sinus
otherwise the visualized portions of the paranasal sinuses and
mastoid air cells are predominantly clear. Orbits are unremarkable.

Other: None.
IMPRESSION: 1. No acute intracranial findings.
2. Left sphenoid sinus mucous retention cyst.
